# Patient Record
Sex: Male | Born: 1955 | Race: White | Hispanic: No | Marital: Single | Smoking: Never smoker
Health system: Southern US, Community
[De-identification: ages and names within clinical notes are randomized; demographics above are authoritative.]

---

## 2001-01-03 ENCOUNTER — Emergency Department (HOSPITAL_COMMUNITY): Admission: EM | Admit: 2001-01-03 | Discharge: 2001-01-03 | Payer: Self-pay | Admitting: Emergency Medicine

## 2004-03-31 ENCOUNTER — Emergency Department (HOSPITAL_COMMUNITY): Admission: EM | Admit: 2004-03-31 | Discharge: 2004-03-31 | Payer: Self-pay | Admitting: Family Medicine

## 2008-03-18 ENCOUNTER — Emergency Department (HOSPITAL_COMMUNITY): Admission: EM | Admit: 2008-03-18 | Discharge: 2008-03-18 | Payer: Self-pay | Admitting: Emergency Medicine

## 2010-12-30 LAB — URINE MICROSCOPIC-ADD ON

## 2010-12-30 LAB — URINALYSIS, ROUTINE W REFLEX MICROSCOPIC
Glucose, UA: NEGATIVE mg/dL
Specific Gravity, Urine: 1.026 (ref 1.005–1.030)

## 2010-12-30 LAB — POCT I-STAT, CHEM 8
BUN: 19 mg/dL (ref 6–23)
Chloride: 105 mEq/L (ref 96–112)
Sodium: 142 mEq/L (ref 135–145)
TCO2: 24 mmol/L (ref 0–100)

## 2010-12-30 LAB — DIFFERENTIAL
Basophils Absolute: 0 10*3/uL (ref 0.0–0.1)
Eosinophils Relative: 0 % (ref 0–5)
Lymphocytes Relative: 7 % — ABNORMAL LOW (ref 12–46)
Neutro Abs: 10.6 10*3/uL — ABNORMAL HIGH (ref 1.7–7.7)
Neutrophils Relative %: 87 % — ABNORMAL HIGH (ref 43–77)

## 2010-12-30 LAB — CBC
Platelets: 233 10*3/uL (ref 150–400)
RDW: 12.6 % (ref 11.5–15.5)

## 2015-08-09 ENCOUNTER — Emergency Department (HOSPITAL_COMMUNITY): Payer: Worker's Compensation

## 2015-08-09 ENCOUNTER — Emergency Department (HOSPITAL_COMMUNITY)
Admission: EM | Admit: 2015-08-09 | Discharge: 2015-08-09 | Disposition: A | Discharge status: Home / Self Care | Payer: Worker's Compensation | Attending: Emergency Medicine | Admitting: Emergency Medicine

## 2015-08-09 ENCOUNTER — Encounter (HOSPITAL_COMMUNITY): Payer: Self-pay | Admitting: *Deleted

## 2015-08-09 DIAGNOSIS — W311XXA Contact with metalworking machines, initial encounter: Secondary | ICD-10-CM | POA: Insufficient documentation

## 2015-08-09 DIAGNOSIS — Y9389 Activity, other specified: Secondary | ICD-10-CM | POA: Insufficient documentation

## 2015-08-09 DIAGNOSIS — S62629B Displaced fracture of medial phalanx of unspecified finger, initial encounter for open fracture: Secondary | ICD-10-CM

## 2015-08-09 DIAGNOSIS — S6991XA Unspecified injury of right wrist, hand and finger(s), initial encounter: Secondary | ICD-10-CM | POA: Diagnosis present

## 2015-08-09 DIAGNOSIS — S62602B Fracture of unspecified phalanx of right middle finger, initial encounter for open fracture: Secondary | ICD-10-CM | POA: Insufficient documentation

## 2015-08-09 DIAGNOSIS — S61312A Laceration without foreign body of right middle finger with damage to nail, initial encounter: Secondary | ICD-10-CM | POA: Diagnosis not present

## 2015-08-09 DIAGNOSIS — Y998 Other external cause status: Secondary | ICD-10-CM | POA: Insufficient documentation

## 2015-08-09 DIAGNOSIS — Y9289 Other specified places as the place of occurrence of the external cause: Secondary | ICD-10-CM | POA: Diagnosis not present

## 2015-08-09 MED ORDER — LIDOCAINE HCL 2 % IJ SOLN
10.0000 mL | Freq: Once | INTRAMUSCULAR | Status: AC
  Administered 2015-08-09: 200 mg via INTRADERMAL
  Filled 2015-08-09: qty 20

## 2015-08-09 MED ORDER — OXYCODONE-ACETAMINOPHEN 5-325 MG PO TABS
ORAL_TABLET | ORAL | Status: AC
  Administered 2015-08-09: 1 via ORAL
  Filled 2015-08-09: qty 1

## 2015-08-09 MED ORDER — OXYCODONE-ACETAMINOPHEN 5-325 MG PO TABS: 2.0000 | ORAL_TABLET | ORAL | Status: AC | PRN

## 2015-08-09 MED ORDER — OXYCODONE-ACETAMINOPHEN 5-325 MG PO TABS
1.0000 | ORAL_TABLET | ORAL | Status: AC | PRN
  Administered 2015-08-09 (×2): 1 via ORAL
  Filled 2015-08-09: qty 1

## 2015-08-09 MED ORDER — CLINDAMYCIN HCL 150 MG PO CAPS: 150.0000 mg | ORAL_CAPSULE | Freq: Four times a day (QID) | ORAL | Status: AC

## 2015-08-09 MED ORDER — OXYCODONE-ACETAMINOPHEN 5-325 MG PO TABS
1.0000 | ORAL_TABLET | Freq: Once | ORAL | Status: AC
  Administered 2015-08-09: 1 via ORAL
  Filled 2015-08-09: qty 1

## 2015-08-09 NOTE — Progress Notes (Signed)
Orthopedic Tech Progress Note Patient Details:  Eric Wong 11/21/1955 [id number] Applied sterile petrolatum dressing to sutured wound on Lt. 3rd finger, then applied static aluminum/foam splint to Lt. 3rd finger. Motion and sensation intact before and after splinting.  Capillary refill less than 2 seconds before and after splinting. Ortho Devices Type of Ortho Device: Finger splint Ortho Device/Splint Location: Lt. 3rd Ortho Device/Splint Interventions: Application   Eric Wong, Eric Wong L 08/09/2015, 4:58 PM

## 2015-08-09 NOTE — ED Notes (Signed)
Ortho tech at the bedside.  

## 2015-08-09 NOTE — ED Provider Notes (Signed)
CSN: [id number]     Arrival date & time 08/09/15  1213 History   First MD Initiated Contact with Patient 08/09/15 1411     Chief Complaint  Patient presents with  . Finger Injury    HPI Comments: 60 year old right-hand-dominant male with not significant medical history presents with finger laceration on his left middle finger. The incident occurred 4 hours ago. He states he was using a skill saw and the protection shield was up and the saw started to come towards him. He put his hand out to stop the saw which resulted in cutting his finger. He reports pain. Denies decreased sensation. Tetanus is up-to-date - last shot was in Oct 2016. He is not on any blood thinners.   History reviewed. No pertinent past medical history. History reviewed. No pertinent past surgical history. History reviewed. No pertinent family history. Social History  Substance Use Topics  . Smoking status: Never Smoker   . Smokeless tobacco: None  . Alcohol Use: Yes     Comment: occ    Review of Systems  Skin: Positive for wound.  Neurological: Negative for numbness.    Allergies  Keflex  Home Medications   Prior to Admission medications   Not on File   BP 144/100 mmHg  Pulse 91  Temp(Src) 98.1 F (36.7 C) (Oral)  Resp 18  SpO2 97%   Physical Exam  Constitutional: He is oriented to person, place, and time. He appears well-developed and well-nourished. No distress.  HENT:  Head: Normocephalic and atraumatic.  Eyes: Conjunctivae are normal. Pupils are equal, round, and reactive to light. Right eye exhibits no discharge. Left eye exhibits no discharge. No scleral icterus.  Neck: Normal range of motion.  Pulmonary/Chest: Effort normal. No respiratory distress.  Musculoskeletal:  Left middle finger - 3 cm laceration at the distal end of middle finger. N/V intact Left ringer finger - Abrasion on distal end of ring finger  Neurological: He is alert and oriented to person, place, and time.  Skin: Skin is  warm and dry.  Psychiatric: He has a normal mood and affect.      ED Course  Procedures (including critical care time) Labs Review Labs Reviewed - No data to display  Imaging Review Dg Finger Middle Left  08/09/2015  CLINICAL DATA:  Laceration cutting wood with skill saw. EXAM: LEFT MIDDLE FINGER 2+V COMPARISON:  None. FINDINGS: There is a comminuted fracture involving the tuft of the middle finger with suspected adjacent soft tissue laceration. No intra-articular extension. No radiopaque foreign body. IMPRESSION: Comminuted fracture involving the tuft of the middle finger without intra-articular extension or radiopaque foreign body. Electronically Signed   By: Simonne Come M.D.   On: 08/09/2015 12:44   I have personally reviewed and evaluated these images and lab results as part of my medical decision-making.   EKG Interpretation None     LACERATION REPAIR Performed by: Bethel Born Authorized by: Bethel Born Consent: Verbal consent obtained. Risks and benefits: risks, benefits and alternatives were discussed Consent given by: patient Patient identity confirmed: provided demographic data Prepped and Draped in normal sterile fashion Wound explored  Laceration Location: left distal middle finger  Laceration Length: 3 cm  No Foreign Bodies seen or palpated  Anesthesia: local infiltration  Local anesthetic: lidocaine 2%  Anesthetic total: 5 ml  Irrigation method: syringe, Saf-clens Amount of cleaning: standard  Skin closure: 5-0 Vicryl  Number of sutures: 3   Technique: Simple interrupted  Patient tolerance: Patient tolerated  the procedure well with no immediate complications.  MDM   Final diagnoses:  Fracture of finger, middle phalanx, right, open, initial encounter  Laceration of right middle finger w/o foreign body with damage to nail, initial encounter   60 year old male presents with left finger laceration and fracture. X-ray shows comminuted  fracture of the middle finger. Shared visit with Dr. Rosalia Hammersay who consulted Dr. Melvyn Novasrtmann with Hand surgery. He would like to have the finger cleaned, put in a splint, and and stictched up with outpatient f/u and antibiotics. Will rx Clindamycin and pain medicine. Wound care instructions given. Patient is NAD, non-toxic, with stable VS. Patient is informed of clinical course, understands medical decision making process, and agrees with plan. Opportunity for questions provided and all questions answered. Return precautions given.     Bethel BornKelly Marie Paislyn Domenico, PA-C 08/10/15 0630  Margarita Grizzleanielle Ray, MD 08/10/15 [phone]

## 2015-08-09 NOTE — ED Notes (Signed)
PA at the bedside.

## 2015-08-09 NOTE — ED Notes (Signed)
Suture Cart at the bedside.  ?

## 2015-08-09 NOTE — ED Notes (Signed)
Pt reports left middle finger injury this am involving table saw. Pt went to [facility] and was told to come here for hand eval. Bandage applied pta.

## 2016-02-24 ENCOUNTER — Ambulatory Visit (INDEPENDENT_AMBULATORY_CARE_PROVIDER_SITE_OTHER): Payer: Self-pay | Admitting: Orthopaedic Surgery

## 2016-07-13 ENCOUNTER — Ambulatory Visit (INDEPENDENT_AMBULATORY_CARE_PROVIDER_SITE_OTHER): Payer: BLUE CROSS/BLUE SHIELD | Admitting: Orthopaedic Surgery

## 2016-07-13 DIAGNOSIS — M1712 Unilateral primary osteoarthritis, left knee: Secondary | ICD-10-CM | POA: Insufficient documentation

## 2016-07-13 MED ORDER — BUPIVACAINE HCL 0.5 % IJ SOLN
2.0000 mL | INTRAMUSCULAR | Status: AC | PRN
  Administered 2016-07-13: 2 mL via INTRA_ARTICULAR

## 2016-07-13 MED ORDER — METHYLPREDNISOLONE ACETATE 40 MG/ML IJ SUSP
40.0000 mg | INTRAMUSCULAR | Status: AC | PRN
  Administered 2016-07-13: 40 mg via INTRA_ARTICULAR

## 2016-07-13 MED ORDER — LIDOCAINE HCL 1 % IJ SOLN
2.0000 mL | INTRAMUSCULAR | Status: AC | PRN
  Administered 2016-07-13: 2 mL

## 2016-07-13 NOTE — Progress Notes (Signed)
   Office Visit Note   Patient: Eric Wong           Date of Birth: [birth year]           MRN: [id number] Visit Date: 07/13/2016              Requested by: No referring provider defined for this encounter. PCP: No PCP Per Patient   Assessment & Plan: Visit Diagnoses:  1. Unilateral primary osteoarthritis, left knee     Plan: Patient has exacerbation of his knee arthritis. Cortisone injection was performed today patient tolerated this well. Follow-up with me as needed.  Follow-Up Instructions: Return if symptoms worsen or fail to improve.   Orders:  No orders of the defined types were placed in this encounter.  No orders of the defined types were placed in this encounter.     Procedures: Large Joint Inj Date/Time: 07/13/2016 8:18 AM Performed by: Tarry Kos Authorized by: Tarry Kos   Consent Given by:  Patient Timeout: prior to procedure the correct patient, procedure, and site was verified   Indications:  Pain Location:  Knee Site:  R knee Prep: patient was prepped and draped in usual sterile fashion   Needle Size:  22 G Ultrasound Guidance: No   Fluoroscopic Guidance: No   Arthrogram: No   Medications:  2 mL lidocaine 1 %; 2 mL bupivacaine 0.5 %; 40 mg methylPREDNISolone acetate 40 MG/ML Patient tolerance:  Patient tolerated the procedure well with no immediate complications     Clinical Data: No additional findings.   Subjective: Chief Complaint  Patient presents with  . Left Knee - Pain    Patient follows up today for left knee pain that's been getting worse. I gave him a cortisone injection about 2 years ago and has helped since now. He is status post 2 knee arthroscopic surgeries and meniscal debridements. He does endorse popping and worsening pain and weakness with increased activity.    Review of Systems   Objective: Vital Signs: There were no vitals taken for this visit.  Physical Exam  Ortho Exam Left knee exam shows a trace  effusion. Good range of motion. No warmth no significant swelling. Specialty Comments:  No specialty comments available.  Imaging: No results found.   PMFS History: Patient Active Problem List   Diagnosis Date Noted  . Unilateral primary osteoarthritis, left knee 07/13/2016   No past medical history on file.  No family history on file.  No past surgical history on file. Social History   Occupational History  . Not on file.   Social History Main Topics  . Smoking status: Never Smoker  . Smokeless tobacco: Not on file  . Alcohol use Yes     Comment: occ  . Drug use: No  . Sexual activity: Not on file

## 2017-05-10 ENCOUNTER — Ambulatory Visit (INDEPENDENT_AMBULATORY_CARE_PROVIDER_SITE_OTHER): Payer: BLUE CROSS/BLUE SHIELD

## 2017-05-10 ENCOUNTER — Ambulatory Visit (INDEPENDENT_AMBULATORY_CARE_PROVIDER_SITE_OTHER): Payer: BLUE CROSS/BLUE SHIELD | Admitting: Orthopaedic Surgery

## 2017-05-10 ENCOUNTER — Encounter (INDEPENDENT_AMBULATORY_CARE_PROVIDER_SITE_OTHER): Payer: Self-pay | Admitting: Orthopaedic Surgery

## 2017-05-10 DIAGNOSIS — M1712 Unilateral primary osteoarthritis, left knee: Secondary | ICD-10-CM

## 2017-05-10 DIAGNOSIS — M25562 Pain in left knee: Secondary | ICD-10-CM | POA: Diagnosis not present

## 2017-05-10 MED ORDER — METHYLPREDNISOLONE ACETATE 40 MG/ML IJ SUSP
40.0000 mg | INTRAMUSCULAR | Status: AC | PRN
Start: 2017-05-10 — End: 2017-05-10
  Administered 2017-05-10: 40 mg via INTRA_ARTICULAR

## 2017-05-10 MED ORDER — LIDOCAINE HCL 1 % IJ SOLN
2.0000 mL | INTRAMUSCULAR | Status: AC | PRN
  Administered 2017-05-10: 2 mL

## 2017-05-10 MED ORDER — DICLOFENAC SODIUM 1 % TD GEL: 2.0000 g | Freq: Four times a day (QID) | TRANSDERMAL | 5 refills | Status: AC

## 2017-05-10 MED ORDER — BUPIVACAINE HCL 0.5 % IJ SOLN
2.0000 mL | INTRAMUSCULAR | Status: AC | PRN
  Administered 2017-05-10: 2 mL via INTRA_ARTICULAR
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
## 2020-06-28 ENCOUNTER — Other Ambulatory Visit: Payer: Self-pay

## 2020-06-28 ENCOUNTER — Other Ambulatory Visit: Payer: Self-pay | Admitting: Occupational Medicine

## 2020-06-28 ENCOUNTER — Ambulatory Visit: Payer: Self-pay

## 2020-06-28 DIAGNOSIS — M25561 Pain in right knee: Secondary | ICD-10-CM

## 2022-06-18 IMAGING — DX DG KNEE COMPLETE 4+V*R*
4 series · 4 of 4 positions shown · non-contrast
Comparison: None.

CLINICAL DATA: Right knee pain

EXAM:
RIGHT KNEE - COMPLETE 4+ VIEW

[knee pa]
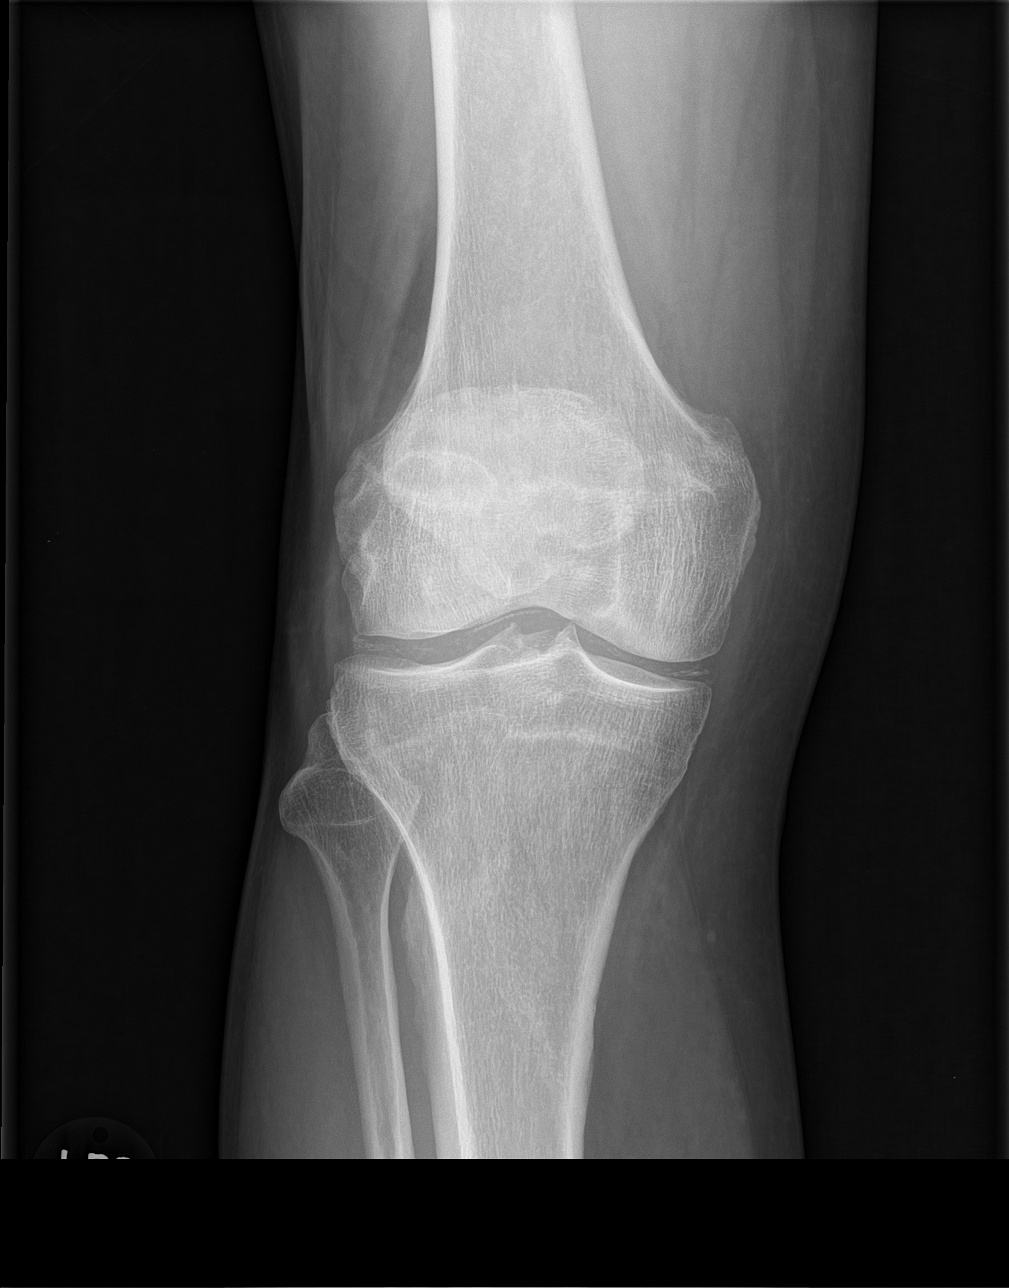

[knee obl (1 of 2)]
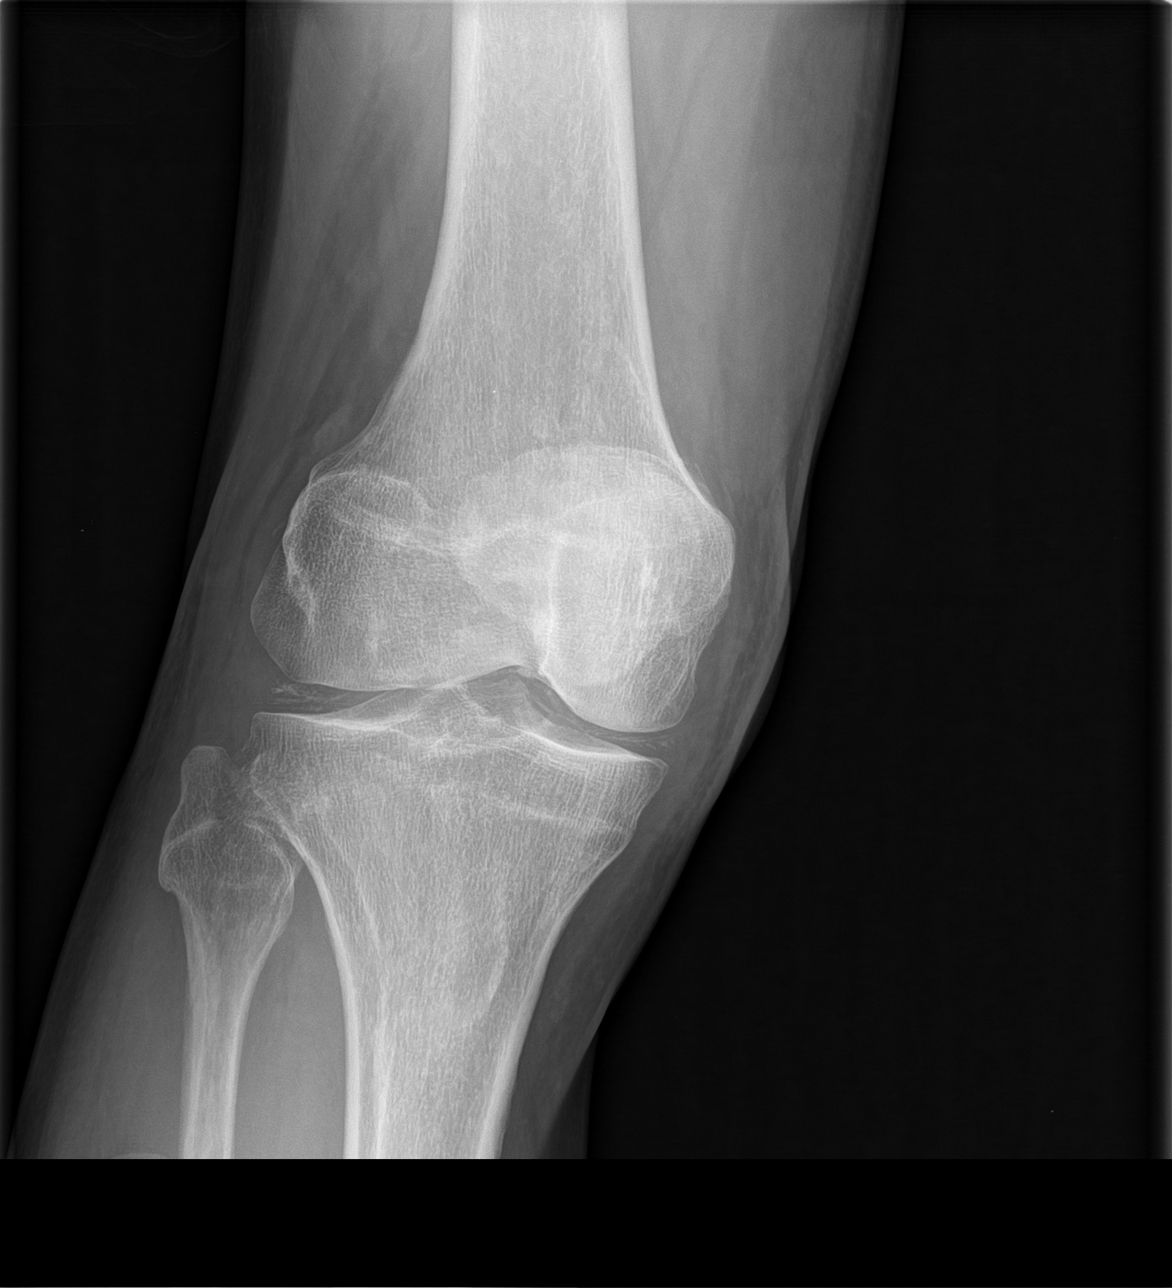

[knee obl (2 of 2)]
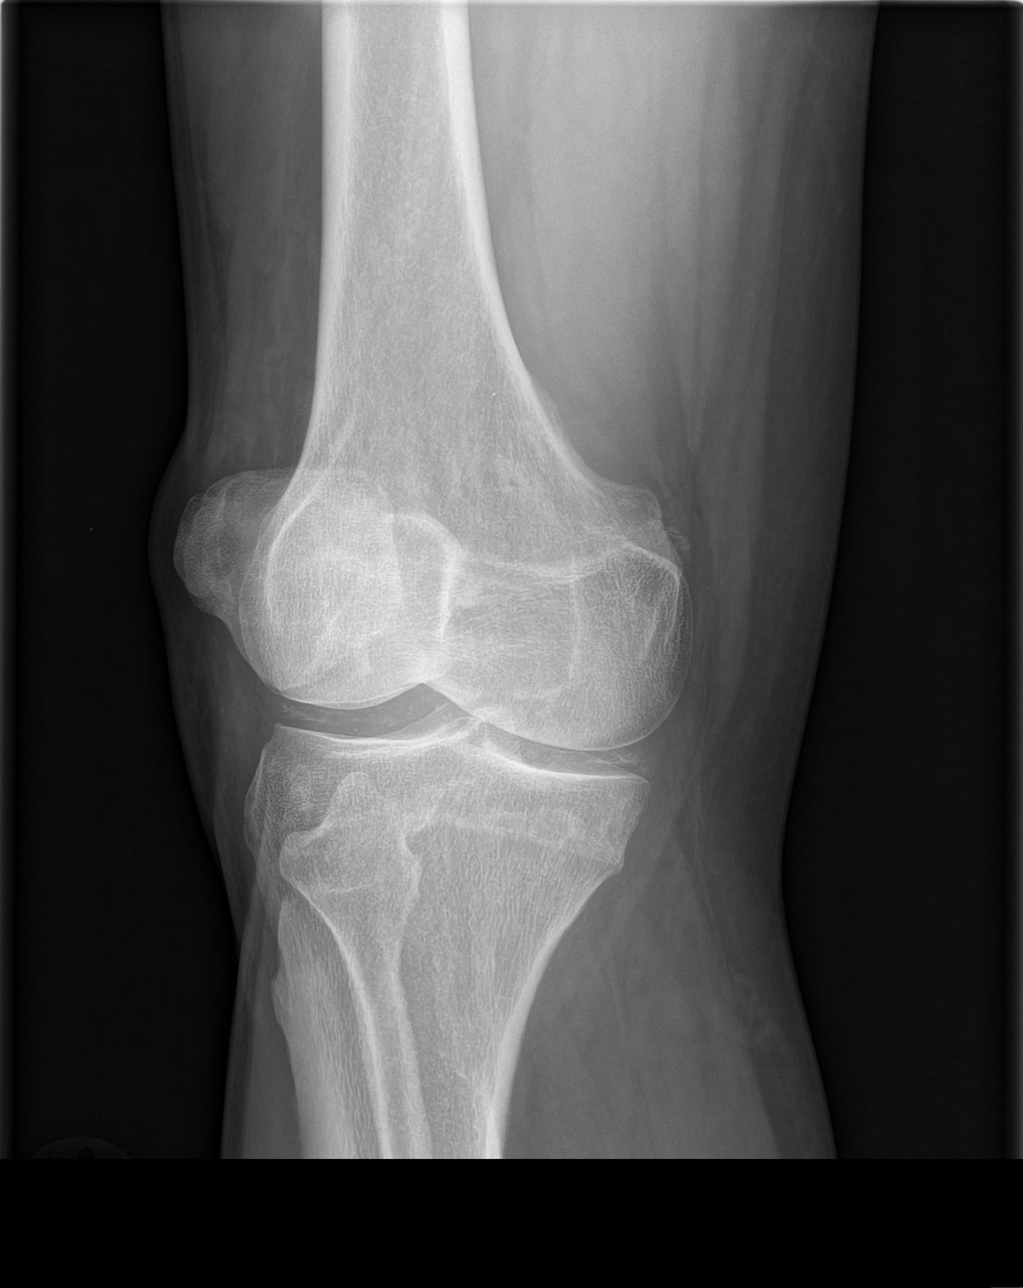

[knee lat]
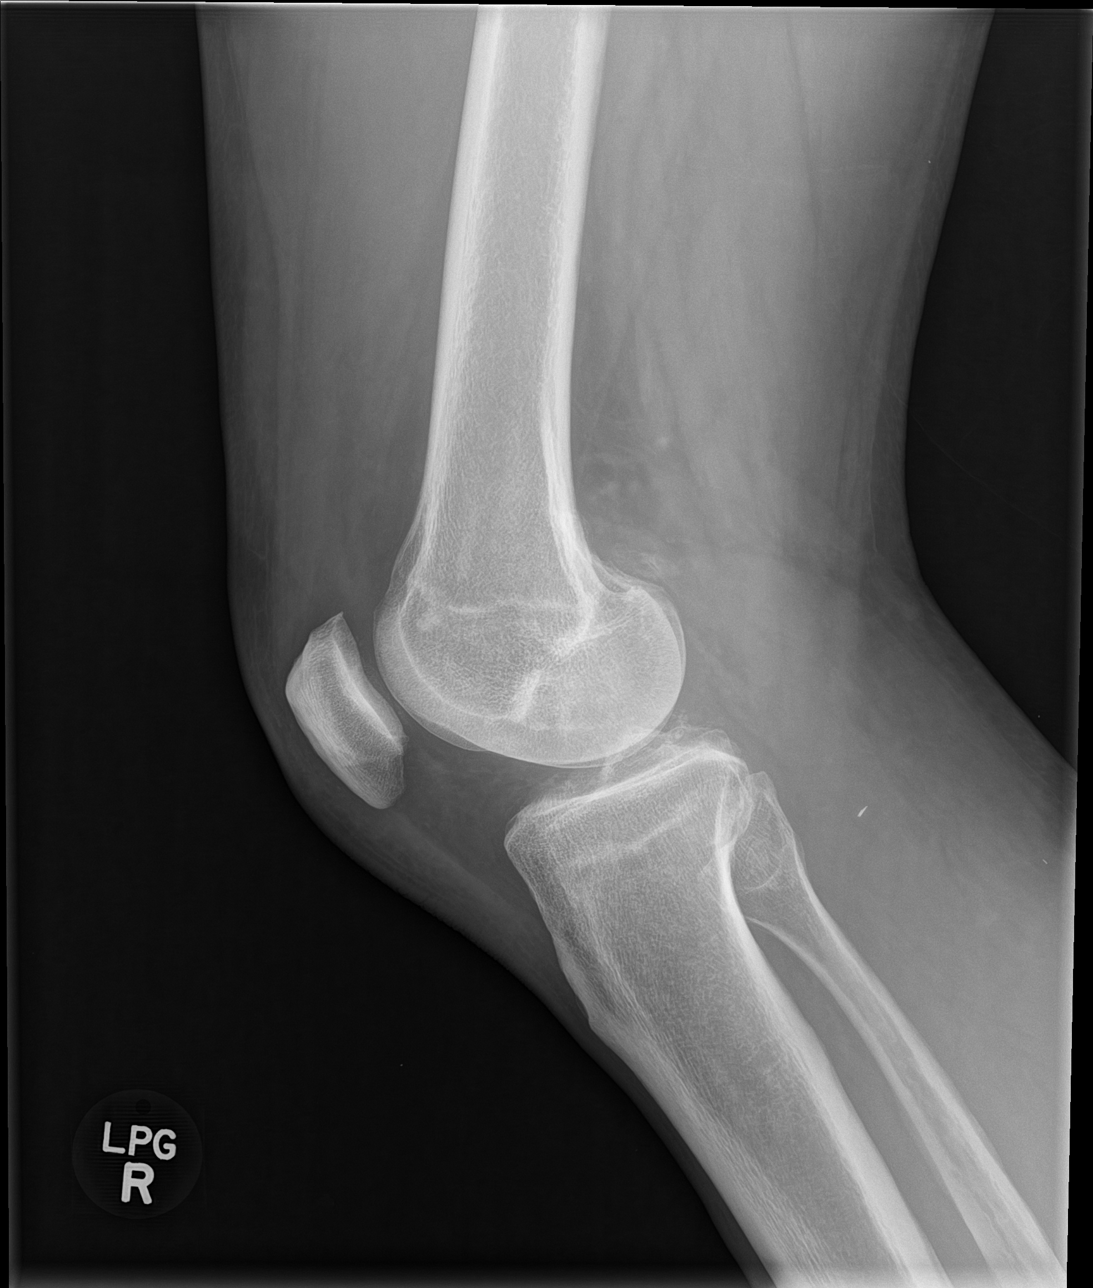

[4 of 4 positions shown; findings below may reference images not displayed]

FINDINGS: No evidence of acute fracture or malalignment. Trace suprapatellar
knee joint effusion. Chondrocalcinosis present in the medial and
lateral compartments. Mild degenerative change with peaking of the
tibial spines. Soft tissues are otherwise unremarkable.
IMPRESSION: 1. No acute fracture or malalignment.
2. Chondrocalcinosis in the medial and lateral compartments.
3. Trace suprapatellar knee joint effusion, likely degenerative.
